# Patient Record
Sex: Male | Born: 1955 | Race: White | Hispanic: No | Marital: Married | Smoking: Never smoker
Health system: Southern US, Community
[De-identification: ages and names within clinical notes are randomized; demographics above are authoritative.]

## PROBLEM LIST (undated history)

## (undated) DIAGNOSIS — N189 Chronic kidney disease, unspecified: Secondary | ICD-10-CM

## (undated) DIAGNOSIS — K219 Gastro-esophageal reflux disease without esophagitis: Secondary | ICD-10-CM

## (undated) DIAGNOSIS — I10 Essential (primary) hypertension: Secondary | ICD-10-CM

## (undated) DIAGNOSIS — M1990 Unspecified osteoarthritis, unspecified site: Secondary | ICD-10-CM

## (undated) HISTORY — PX: TONSILLECTOMY: SUR1361

## (undated) HISTORY — DX: Chronic kidney disease, unspecified: N18.9

## (undated) HISTORY — PX: APPENDECTOMY: SHX54

## (undated) HISTORY — DX: Unspecified osteoarthritis, unspecified site: M19.90

## (undated) HISTORY — DX: Gastro-esophageal reflux disease without esophagitis: K21.9

---

## 2015-07-07 ENCOUNTER — Other Ambulatory Visit: Payer: Self-pay | Admitting: Family Medicine

## 2015-07-07 DIAGNOSIS — R7989 Other specified abnormal findings of blood chemistry: Secondary | ICD-10-CM

## 2015-07-07 DIAGNOSIS — R945 Abnormal results of liver function studies: Principal | ICD-10-CM

## 2015-07-16 ENCOUNTER — Ambulatory Visit
Admission: RE | Admit: 2015-07-16 | Discharge: 2015-07-16 | Disposition: A | Discharge status: Home / Self Care | Payer: BLUE CROSS/BLUE SHIELD | Source: Ambulatory Visit | Attending: Family Medicine | Admitting: Family Medicine

## 2015-07-16 DIAGNOSIS — R945 Abnormal results of liver function studies: Principal | ICD-10-CM

## 2015-07-16 DIAGNOSIS — R7989 Other specified abnormal findings of blood chemistry: Secondary | ICD-10-CM

## 2016-02-16 DIAGNOSIS — Z125 Encounter for screening for malignant neoplasm of prostate: Secondary | ICD-10-CM | POA: Diagnosis not present

## 2016-02-16 DIAGNOSIS — Z136 Encounter for screening for cardiovascular disorders: Secondary | ICD-10-CM | POA: Diagnosis not present

## 2016-02-16 DIAGNOSIS — Z0000 Encounter for general adult medical examination without abnormal findings: Secondary | ICD-10-CM | POA: Diagnosis not present

## 2016-02-16 DIAGNOSIS — R739 Hyperglycemia, unspecified: Secondary | ICD-10-CM | POA: Diagnosis not present

## 2016-02-18 DIAGNOSIS — K760 Fatty (change of) liver, not elsewhere classified: Secondary | ICD-10-CM | POA: Diagnosis not present

## 2016-02-18 DIAGNOSIS — I10 Essential (primary) hypertension: Secondary | ICD-10-CM | POA: Diagnosis not present

## 2016-02-18 DIAGNOSIS — E785 Hyperlipidemia, unspecified: Secondary | ICD-10-CM | POA: Diagnosis not present

## 2016-03-02 DIAGNOSIS — I10 Essential (primary) hypertension: Secondary | ICD-10-CM | POA: Diagnosis not present

## 2016-03-02 DIAGNOSIS — Z6841 Body Mass Index (BMI) 40.0 and over, adult: Secondary | ICD-10-CM | POA: Diagnosis not present

## 2016-03-02 DIAGNOSIS — K529 Noninfective gastroenteritis and colitis, unspecified: Secondary | ICD-10-CM | POA: Diagnosis not present

## 2016-03-31 DIAGNOSIS — Z6841 Body Mass Index (BMI) 40.0 and over, adult: Secondary | ICD-10-CM | POA: Diagnosis not present

## 2016-03-31 DIAGNOSIS — I10 Essential (primary) hypertension: Secondary | ICD-10-CM | POA: Diagnosis not present

## 2016-03-31 DIAGNOSIS — E785 Hyperlipidemia, unspecified: Secondary | ICD-10-CM | POA: Diagnosis not present

## 2016-04-26 DIAGNOSIS — D2312 Other benign neoplasm of skin of left eyelid, including canthus: Secondary | ICD-10-CM | POA: Diagnosis not present

## 2016-04-26 DIAGNOSIS — I10 Essential (primary) hypertension: Secondary | ICD-10-CM | POA: Diagnosis not present

## 2016-04-26 DIAGNOSIS — H524 Presbyopia: Secondary | ICD-10-CM | POA: Diagnosis not present

## 2016-04-26 DIAGNOSIS — D2311 Other benign neoplasm of skin of right eyelid, including canthus: Secondary | ICD-10-CM | POA: Diagnosis not present

## 2016-04-26 DIAGNOSIS — R945 Abnormal results of liver function studies: Secondary | ICD-10-CM | POA: Diagnosis not present

## 2016-04-26 DIAGNOSIS — K760 Fatty (change of) liver, not elsewhere classified: Secondary | ICD-10-CM | POA: Diagnosis not present

## 2016-04-26 DIAGNOSIS — H52203 Unspecified astigmatism, bilateral: Secondary | ICD-10-CM | POA: Diagnosis not present

## 2016-07-28 DIAGNOSIS — I10 Essential (primary) hypertension: Secondary | ICD-10-CM | POA: Diagnosis not present

## 2016-07-28 DIAGNOSIS — Z23 Encounter for immunization: Secondary | ICD-10-CM | POA: Diagnosis not present

## 2016-07-28 DIAGNOSIS — R945 Abnormal results of liver function studies: Secondary | ICD-10-CM | POA: Diagnosis not present

## 2016-08-01 DIAGNOSIS — I10 Essential (primary) hypertension: Secondary | ICD-10-CM | POA: Diagnosis not present

## 2016-08-01 DIAGNOSIS — R945 Abnormal results of liver function studies: Secondary | ICD-10-CM | POA: Diagnosis not present

## 2016-08-01 DIAGNOSIS — Z6841 Body Mass Index (BMI) 40.0 and over, adult: Secondary | ICD-10-CM | POA: Diagnosis not present

## 2016-11-26 IMAGING — US US ABDOMEN COMPLETE
1 series · 13 of 25 positions shown · non-contrast
Comparison: None in PACs

CLINICAL DATA: Elevated liver function studies

EXAM:
ABDOMEN ULTRASOUND COMPLETE

[Series 1: us abdomen complete · 0.32mm/px · 13 of 87 slices shown]
[im 1/87]
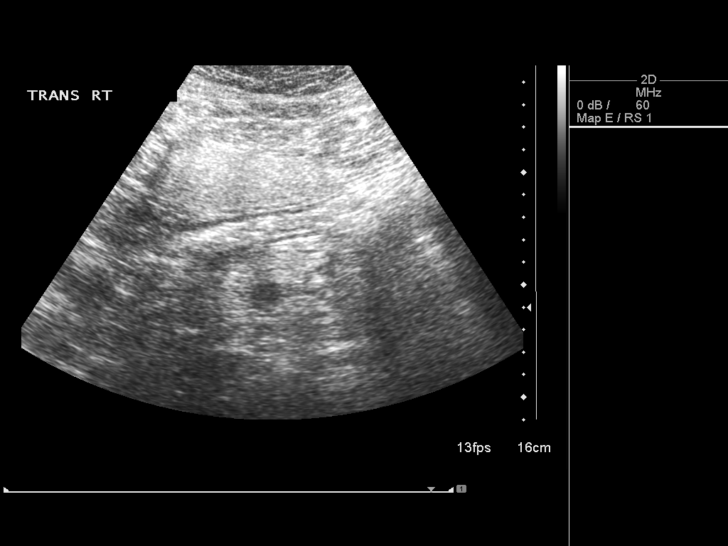
[im 8/87]
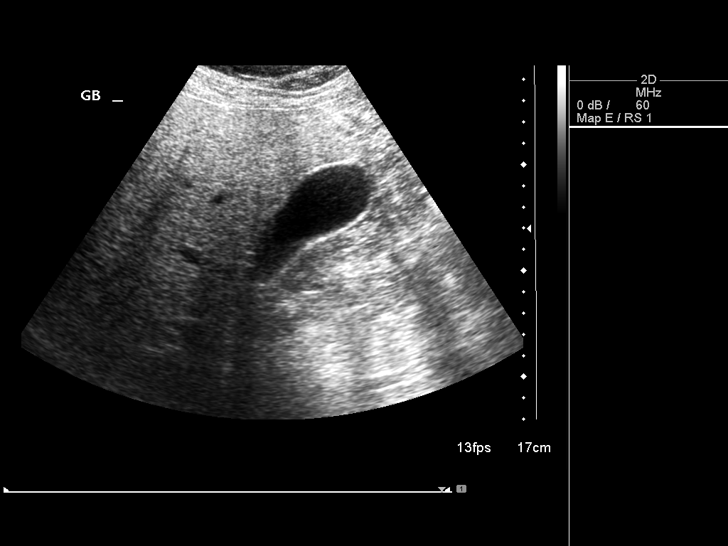
[im 15/87]
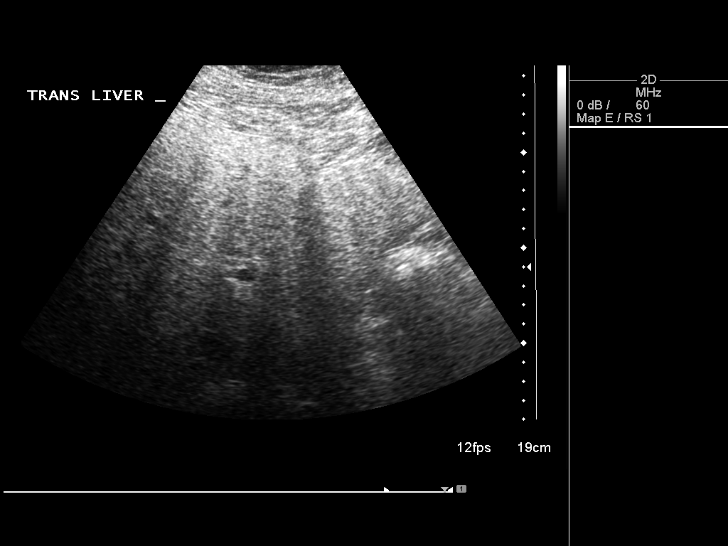
[im 22/87]
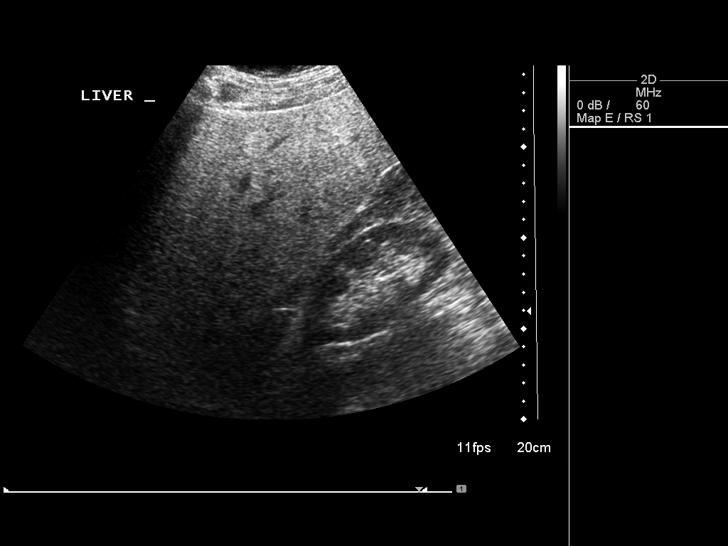
[im 29/87]
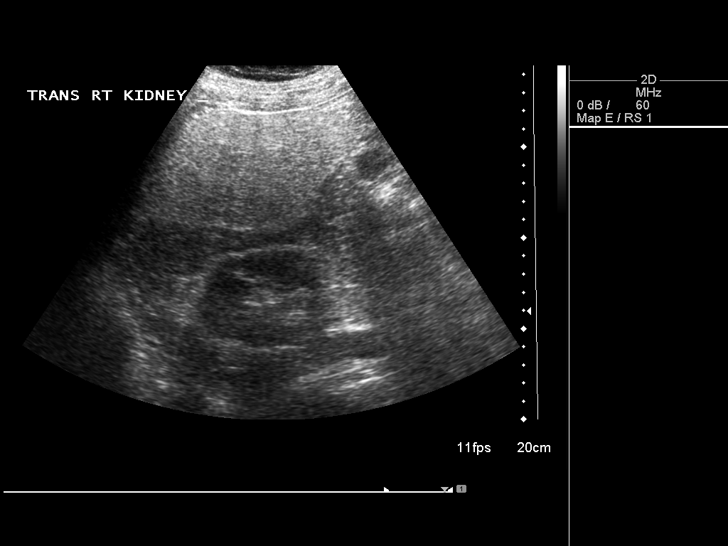
[im 36/87]
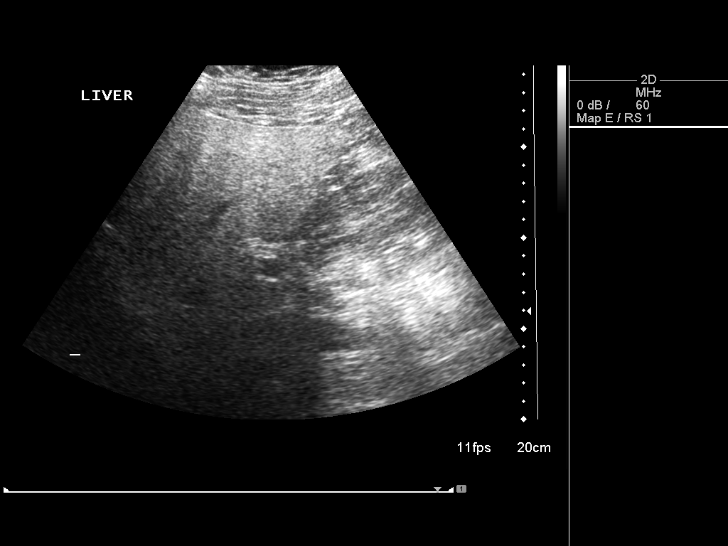
[im 44/87]
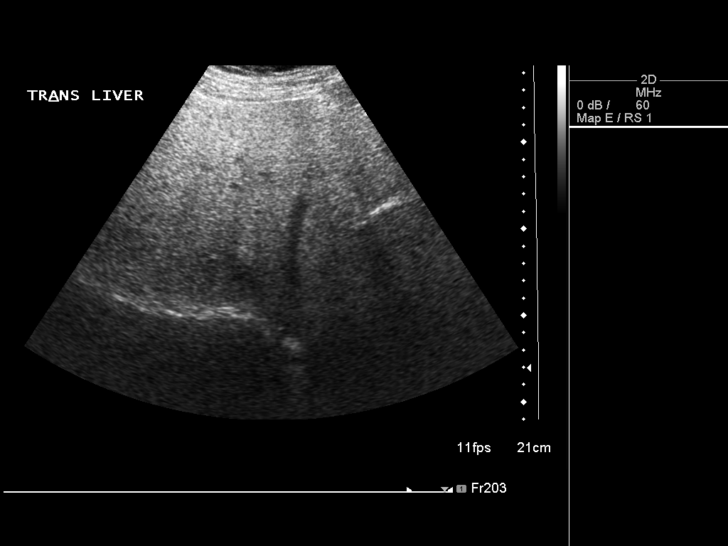
[im 51/87]
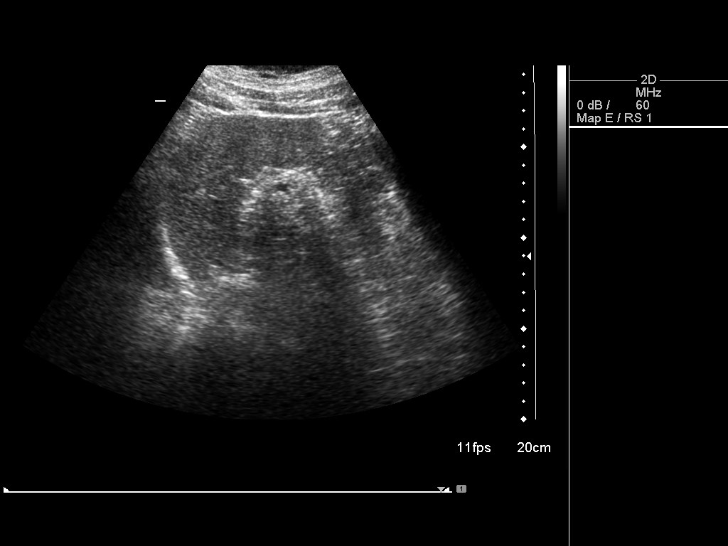
[im 58/87]
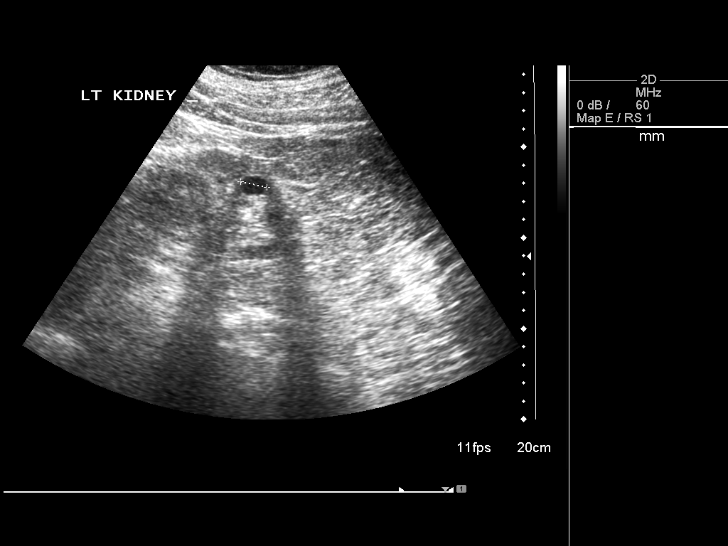
[im 65/87]
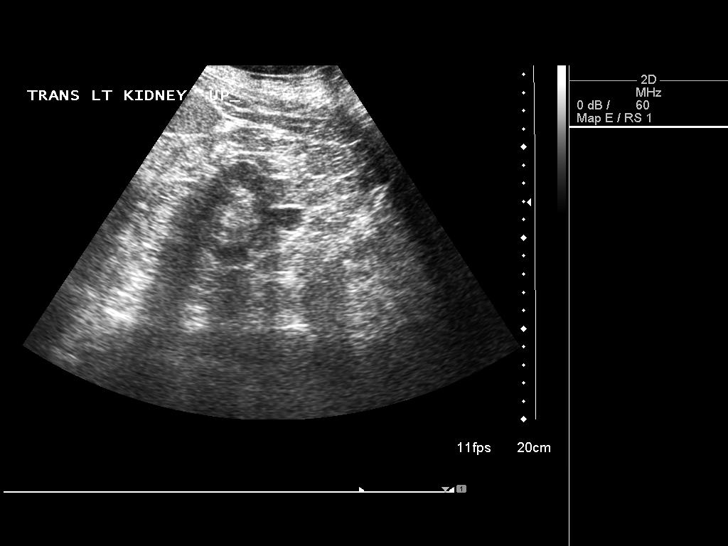
[im 72/87]
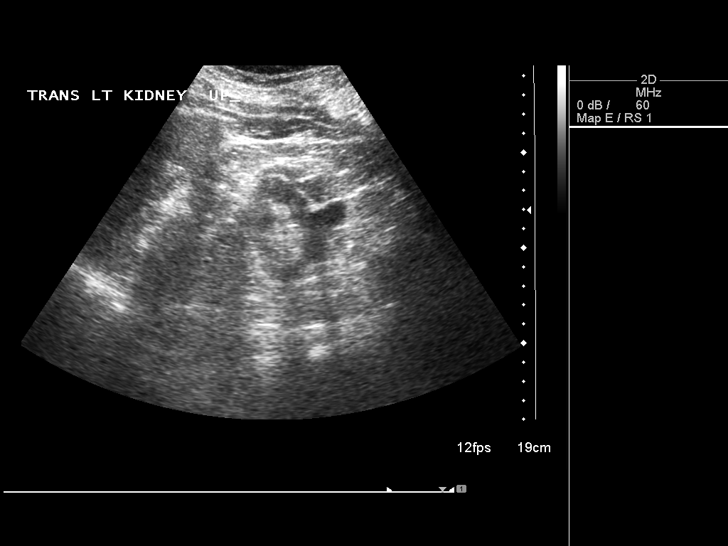
[im 79/87]
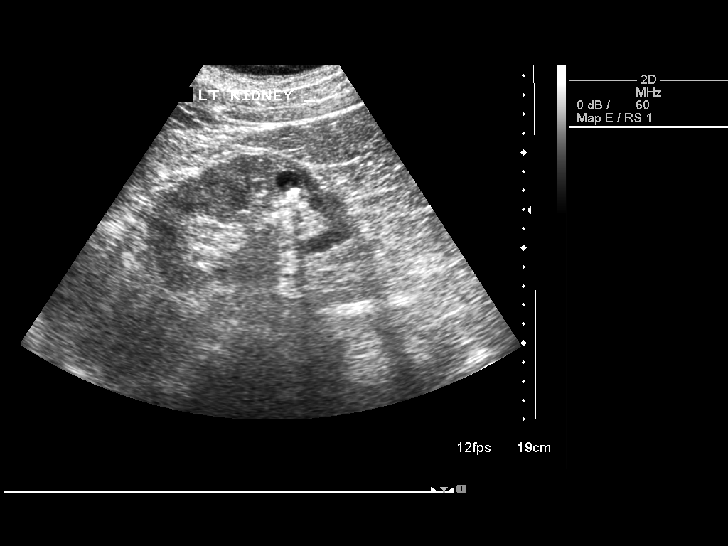
[im 87/87]
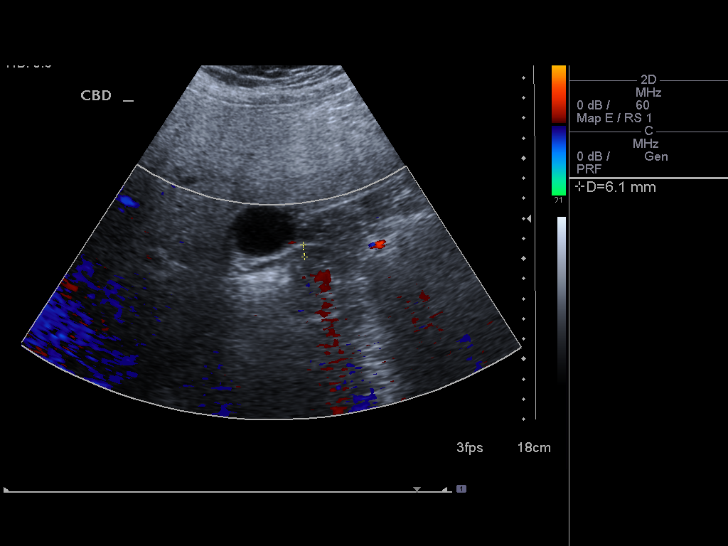

[13 of 25 positions shown; findings below may reference images not displayed]

FINDINGS: The study is limited due to the patient's body habitus as well as
due to bowel gas.

Gallbladder: No gallstones or wall thickening visualized. No
sonographic Murphy sign noted by sonographer.

Common bile duct: Diameter: 6 mm

Liver: The hepatic echotexture is increased diffusely. There is no
focal mass or ductal dilation.

IVC: No abnormality visualized.

Pancreas: Visualized portion unremarkable.

Spleen: Size and appearance within normal limits.

Right Kidney: Length: 11 cm. Echogenicity within normal limits. No
mass or hydronephrosis visualized.

Left Kidney: Length: 10.7 cm. The renal cortical echotexture is
normal. There is an upper pole cyst measuring 1.9 cm in diameter.
There 2 lower pole cysts. One measures 1.5 cm and the second 0.6 cm
in diameter. There is an 8 mm nonobstructing calcified shadowing
stone in the lower pole.

Abdominal aorta: The bowel gas is demonstrated with some difficulty
due to bowel gas. The maximal measured diameter is 3 cm but this
measurement is approximate.

Other findings: No free fluid is observed.
IMPRESSION: 1. Fatty infiltrative change of the liver. No acute gallbladder
pathology. The observed portions of the pancreas are normal.
2. 8 mm nonobstructing lower pole left kidney stone. Simple
appearing cysts in the left kidney.
3. Limited visualization of the abdominal aorta without definite
aneurysm. The maximal measured diameter occurs proximally and is 3
cm.

## 2017-02-21 DIAGNOSIS — Z1329 Encounter for screening for other suspected endocrine disorder: Secondary | ICD-10-CM | POA: Diagnosis not present

## 2017-02-21 DIAGNOSIS — Z114 Encounter for screening for human immunodeficiency virus [HIV]: Secondary | ICD-10-CM | POA: Diagnosis not present

## 2017-02-21 DIAGNOSIS — Z13220 Encounter for screening for lipoid disorders: Secondary | ICD-10-CM | POA: Diagnosis not present

## 2017-02-21 DIAGNOSIS — Z0000 Encounter for general adult medical examination without abnormal findings: Secondary | ICD-10-CM | POA: Diagnosis not present

## 2017-02-21 DIAGNOSIS — Z125 Encounter for screening for malignant neoplasm of prostate: Secondary | ICD-10-CM | POA: Diagnosis not present

## 2017-02-28 DIAGNOSIS — Z1211 Encounter for screening for malignant neoplasm of colon: Secondary | ICD-10-CM | POA: Diagnosis not present

## 2017-02-28 DIAGNOSIS — Z0000 Encounter for general adult medical examination without abnormal findings: Secondary | ICD-10-CM | POA: Diagnosis not present

## 2017-02-28 DIAGNOSIS — Z6839 Body mass index (BMI) 39.0-39.9, adult: Secondary | ICD-10-CM | POA: Diagnosis not present

## 2017-04-06 ENCOUNTER — Encounter: Payer: Self-pay | Admitting: Family Medicine

## 2017-05-30 DIAGNOSIS — M19042 Primary osteoarthritis, left hand: Secondary | ICD-10-CM | POA: Diagnosis not present

## 2017-05-30 DIAGNOSIS — I10 Essential (primary) hypertension: Secondary | ICD-10-CM | POA: Diagnosis not present

## 2017-05-30 DIAGNOSIS — R739 Hyperglycemia, unspecified: Secondary | ICD-10-CM | POA: Diagnosis not present

## 2017-05-30 DIAGNOSIS — Z6841 Body Mass Index (BMI) 40.0 and over, adult: Secondary | ICD-10-CM | POA: Diagnosis not present

## 2017-10-30 DIAGNOSIS — R739 Hyperglycemia, unspecified: Secondary | ICD-10-CM | POA: Diagnosis not present

## 2017-10-30 DIAGNOSIS — I10 Essential (primary) hypertension: Secondary | ICD-10-CM | POA: Diagnosis not present

## 2017-10-30 DIAGNOSIS — M19042 Primary osteoarthritis, left hand: Secondary | ICD-10-CM | POA: Diagnosis not present

## 2017-10-30 DIAGNOSIS — M170 Bilateral primary osteoarthritis of knee: Secondary | ICD-10-CM | POA: Diagnosis not present

## 2018-03-08 DIAGNOSIS — Z13220 Encounter for screening for lipoid disorders: Secondary | ICD-10-CM | POA: Diagnosis not present

## 2018-03-08 DIAGNOSIS — Z0000 Encounter for general adult medical examination without abnormal findings: Secondary | ICD-10-CM | POA: Diagnosis not present

## 2018-03-08 DIAGNOSIS — Z114 Encounter for screening for human immunodeficiency virus [HIV]: Secondary | ICD-10-CM | POA: Diagnosis not present

## 2018-03-08 DIAGNOSIS — Z125 Encounter for screening for malignant neoplasm of prostate: Secondary | ICD-10-CM | POA: Diagnosis not present

## 2018-03-08 DIAGNOSIS — Z1329 Encounter for screening for other suspected endocrine disorder: Secondary | ICD-10-CM | POA: Diagnosis not present

## 2018-03-13 DIAGNOSIS — Z0000 Encounter for general adult medical examination without abnormal findings: Secondary | ICD-10-CM | POA: Diagnosis not present

## 2018-03-13 DIAGNOSIS — N529 Male erectile dysfunction, unspecified: Secondary | ICD-10-CM | POA: Diagnosis not present

## 2018-03-13 DIAGNOSIS — I10 Essential (primary) hypertension: Secondary | ICD-10-CM | POA: Diagnosis not present

## 2018-03-13 DIAGNOSIS — Z1211 Encounter for screening for malignant neoplasm of colon: Secondary | ICD-10-CM | POA: Diagnosis not present

## 2018-03-13 DIAGNOSIS — Z23 Encounter for immunization: Secondary | ICD-10-CM | POA: Diagnosis not present

## 2018-03-13 DIAGNOSIS — R739 Hyperglycemia, unspecified: Secondary | ICD-10-CM | POA: Diagnosis not present

## 2018-03-13 DIAGNOSIS — Z6837 Body mass index (BMI) 37.0-37.9, adult: Secondary | ICD-10-CM | POA: Diagnosis not present

## 2018-06-06 ENCOUNTER — Encounter: Payer: Self-pay | Admitting: Family Medicine

## 2018-06-13 DIAGNOSIS — N529 Male erectile dysfunction, unspecified: Secondary | ICD-10-CM | POA: Diagnosis not present

## 2018-06-13 DIAGNOSIS — Z23 Encounter for immunization: Secondary | ICD-10-CM | POA: Diagnosis not present

## 2018-06-13 DIAGNOSIS — R739 Hyperglycemia, unspecified: Secondary | ICD-10-CM | POA: Diagnosis not present

## 2018-06-13 DIAGNOSIS — Z6836 Body mass index (BMI) 36.0-36.9, adult: Secondary | ICD-10-CM | POA: Diagnosis not present

## 2018-06-13 DIAGNOSIS — I10 Essential (primary) hypertension: Secondary | ICD-10-CM | POA: Diagnosis not present

## 2018-11-15 DIAGNOSIS — I10 Essential (primary) hypertension: Secondary | ICD-10-CM | POA: Diagnosis not present

## 2018-11-15 DIAGNOSIS — N529 Male erectile dysfunction, unspecified: Secondary | ICD-10-CM | POA: Diagnosis not present

## 2018-11-15 DIAGNOSIS — Z719 Counseling, unspecified: Secondary | ICD-10-CM | POA: Diagnosis not present

## 2018-12-07 DIAGNOSIS — R739 Hyperglycemia, unspecified: Secondary | ICD-10-CM | POA: Diagnosis not present

## 2018-12-12 DIAGNOSIS — Z23 Encounter for immunization: Secondary | ICD-10-CM | POA: Diagnosis not present

## 2018-12-12 DIAGNOSIS — R739 Hyperglycemia, unspecified: Secondary | ICD-10-CM | POA: Diagnosis not present

## 2018-12-12 DIAGNOSIS — N529 Male erectile dysfunction, unspecified: Secondary | ICD-10-CM | POA: Diagnosis not present

## 2018-12-12 DIAGNOSIS — I10 Essential (primary) hypertension: Secondary | ICD-10-CM | POA: Diagnosis not present

## 2019-01-28 DIAGNOSIS — N529 Male erectile dysfunction, unspecified: Secondary | ICD-10-CM | POA: Diagnosis not present

## 2019-01-28 DIAGNOSIS — I10 Essential (primary) hypertension: Secondary | ICD-10-CM | POA: Diagnosis not present

## 2019-01-28 DIAGNOSIS — N471 Phimosis: Secondary | ICD-10-CM | POA: Diagnosis not present

## 2019-03-21 DIAGNOSIS — Z125 Encounter for screening for malignant neoplasm of prostate: Secondary | ICD-10-CM | POA: Diagnosis not present

## 2019-03-21 DIAGNOSIS — Z1159 Encounter for screening for other viral diseases: Secondary | ICD-10-CM | POA: Diagnosis not present

## 2019-03-21 DIAGNOSIS — Z0000 Encounter for general adult medical examination without abnormal findings: Secondary | ICD-10-CM | POA: Diagnosis not present

## 2019-03-26 DIAGNOSIS — Z23 Encounter for immunization: Secondary | ICD-10-CM | POA: Diagnosis not present

## 2019-03-26 DIAGNOSIS — Z6836 Body mass index (BMI) 36.0-36.9, adult: Secondary | ICD-10-CM | POA: Diagnosis not present

## 2019-03-26 DIAGNOSIS — Z1211 Encounter for screening for malignant neoplasm of colon: Secondary | ICD-10-CM | POA: Diagnosis not present

## 2019-03-26 DIAGNOSIS — Z0000 Encounter for general adult medical examination without abnormal findings: Secondary | ICD-10-CM | POA: Diagnosis not present

## 2019-04-03 DIAGNOSIS — R2232 Localized swelling, mass and lump, left upper limb: Secondary | ICD-10-CM | POA: Diagnosis not present

## 2019-04-03 DIAGNOSIS — H5203 Hypermetropia, bilateral: Secondary | ICD-10-CM | POA: Diagnosis not present

## 2019-04-03 DIAGNOSIS — H524 Presbyopia: Secondary | ICD-10-CM | POA: Diagnosis not present

## 2019-04-03 DIAGNOSIS — H2513 Age-related nuclear cataract, bilateral: Secondary | ICD-10-CM | POA: Diagnosis not present

## 2019-04-03 DIAGNOSIS — N529 Male erectile dysfunction, unspecified: Secondary | ICD-10-CM | POA: Diagnosis not present

## 2019-04-10 DIAGNOSIS — M79645 Pain in left finger(s): Secondary | ICD-10-CM | POA: Diagnosis not present

## 2019-04-10 DIAGNOSIS — R2232 Localized swelling, mass and lump, left upper limb: Secondary | ICD-10-CM | POA: Diagnosis not present

## 2019-09-04 DIAGNOSIS — N529 Male erectile dysfunction, unspecified: Secondary | ICD-10-CM | POA: Diagnosis not present

## 2019-09-04 DIAGNOSIS — M19042 Primary osteoarthritis, left hand: Secondary | ICD-10-CM | POA: Diagnosis not present

## 2019-09-04 DIAGNOSIS — R5383 Other fatigue: Secondary | ICD-10-CM | POA: Diagnosis not present

## 2019-09-04 DIAGNOSIS — Z7189 Other specified counseling: Secondary | ICD-10-CM | POA: Diagnosis not present

## 2019-09-04 DIAGNOSIS — Z20828 Contact with and (suspected) exposure to other viral communicable diseases: Secondary | ICD-10-CM | POA: Diagnosis not present

## 2019-09-04 DIAGNOSIS — I10 Essential (primary) hypertension: Secondary | ICD-10-CM | POA: Diagnosis not present

## 2019-09-04 DIAGNOSIS — Z03818 Encounter for observation for suspected exposure to other biological agents ruled out: Secondary | ICD-10-CM | POA: Diagnosis not present

## 2019-09-04 DIAGNOSIS — E669 Obesity, unspecified: Secondary | ICD-10-CM | POA: Diagnosis not present

## 2019-09-30 DIAGNOSIS — Z20828 Contact with and (suspected) exposure to other viral communicable diseases: Secondary | ICD-10-CM | POA: Diagnosis not present

## 2019-09-30 DIAGNOSIS — Z7189 Other specified counseling: Secondary | ICD-10-CM | POA: Diagnosis not present

## 2019-09-30 DIAGNOSIS — E669 Obesity, unspecified: Secondary | ICD-10-CM | POA: Diagnosis not present

## 2019-09-30 DIAGNOSIS — R5383 Other fatigue: Secondary | ICD-10-CM | POA: Diagnosis not present

## 2019-10-04 ENCOUNTER — Other Ambulatory Visit: Payer: Self-pay

## 2019-10-04 ENCOUNTER — Ambulatory Visit
Admission: EM | Admit: 2019-10-04 | Discharge: 2019-10-04 | Disposition: A | Discharge status: Home / Self Care | Payer: BC Managed Care – PPO

## 2019-10-04 DIAGNOSIS — U071 COVID-19: Secondary | ICD-10-CM

## 2019-10-04 HISTORY — DX: Essential (primary) hypertension: I10

## 2019-10-04 MED ORDER — AZITHROMYCIN 250 MG PO TABS: 250.0000 mg | ORAL_TABLET | Freq: Every day | ORAL | 0 refills | Status: DC

## 2019-10-04 NOTE — ED Triage Notes (Signed)
Pt presents with complaints of fever. Reports being positive for COVID and having continued fevers at home. Pt has been taking ibuprofen at home. Temperature is 100 in patient intake. Pt denies any other concerns.

## 2019-10-04 NOTE — ED Provider Notes (Signed)
[name]    CSN: [id number] Arrival date & time:         History   Chief Complaint Chief Complaint  Patient presents with  . Fever    HPI Tyler Sanders is a 64 y.o. male with history of hypertension presenting for fever.  States that he developed nocturnal fever last weekend, underwent Covid testing on Tuesday due to exposure from coworker, and was positive.  Since diagnosis, patient has noted fever during the day.  T-max 102F earlier this week.  Has been taking ibuprofen with some relief.  Denying chills, fatigue, decreased appetite, cough, shortness of breath, chest pain, palpitations, vomiting, diarrhea.  Inquiring about what else he can take for his fever.   Past Medical History:  Diagnosis Date  . Hypertension     There are no problems to display for this patient.   Past Surgical History:  Procedure Laterality Date  . APPENDECTOMY    . TONSILLECTOMY         Home Medications    Prior to Admission medications   Medication Sig Start Date End Date Taking? Authorizing Provider  UNKNOWN TO PATIENT 2 RX's patient does not know the names.   Yes [provider]  azithromycin (ZITHROMAX) 250 MG tablet Take 1 tablet (250 mg total) by mouth daily. Take first 2 tablets together, then 1 every day until finished. 10/04/19   Hall-Potvin, [place], PA-C    Family History Family History  Problem Relation Age of Onset  . Heart disease Mother   . Alcohol abuse Father     Social History Social History   Tobacco Use  . Smoking status: Never Smoker  . Smokeless tobacco: Never Used  Substance Use Topics  . Alcohol use: Yes    Comment: occ  . Drug use: Not on file     Allergies   Patient has no known allergies.   Review of Systems As per HPI   Physical Exam Triage Vital Signs ED Triage Vitals  Enc Vitals Group     BP      Pulse      Resp      Temp      Temp src      SpO2      Weight      Height      Head Circumference    Peak Flow      Pain Score      Pain Loc      Pain Edu?      Excl. in [place]?    No data found.  Updated Vital Signs BP 121/83 (BP Location: Left Arm)   Pulse 87   Temp 100 F (37.8 C) (Oral)   Resp 18   SpO2 91%   Visual Acuity Right Eye Distance:   Left Eye Distance:   Bilateral Distance:    Right Eye Near:   Left Eye Near:    Bilateral Near:     Physical Exam Constitutional:      General: He is not in acute distress.    Appearance: He is not ill-appearing.  HENT:     Head: Normocephalic and atraumatic.     Right Ear: Tympanic membrane, ear canal and external ear normal.     Left Ear: Tympanic membrane, ear canal and external ear normal.     Nose: Nose normal.     Mouth/Throat:     Mouth: Mucous membranes are moist.     Pharynx: Oropharynx is clear.  Eyes:  General: No scleral icterus.    Conjunctiva/sclera: Conjunctivae normal.     Pupils: Pupils are equal, round, and reactive to light.  Cardiovascular:     Rate and Rhythm: Normal rate and regular rhythm.     Heart sounds: No murmur. No gallop.   Pulmonary:     Effort: Pulmonary effort is normal. No respiratory distress.     Breath sounds: No stridor. Rales present. No wheezing.     Comments: RLL rales Musculoskeletal:        General: Normal range of motion.     Cervical back: Neck supple. No tenderness.     Right lower leg: No edema.     Left lower leg: No edema.  Lymphadenopathy:     Cervical: No cervical adenopathy.  Skin:    Capillary Refill: Capillary refill takes less than 2 seconds.     Coloration: Skin is not jaundiced or pale.     Findings: No bruising.  Neurological:     General: No focal deficit present.     Mental Status: He is alert and oriented to person, place, and time.      UC Treatments / Results  Labs (all labs ordered are listed, but only abnormal results are displayed) Labs Reviewed - No data to display  EKG   Radiology No results found.  Procedures Procedures  (including critical care time)  Medications Ordered in UC Medications - No data to display  Initial Impression / Assessment and Plan / UC Course  I have reviewed the triage vital signs and the nursing notes.  Pertinent labs & imaging results that were available during my care of the patient were reviewed by me and considered in my medical decision making (see chart for details).     Patient febrile, nontoxic in office today.  No respiratory distress or chest pain.  Reviewed fever is expected in setting of acute COVID-19 infection.  Advised patient to take Tylenol as antipyretic.  Patient with O2 sats 91-95% in office w/ RLL rales: will start azithromycin today, have patient follow-up with PCP in 1 week for repeat evaluation. Final Clinical Impressions(s) / UC Diagnoses   Final diagnoses:  [id number] virus infection     Discharge Instructions     Take Zpack as prescribed.  For fever/aches: recommend 350 mg-1000 mg of Tylenol (acetaminophen) and/or 200 mg - 800 mg of Advil (ibuprofen, Motrin) every 8 hours as needed.  May alternate between the two throughout the day as they are generally safe to take together.  DO NOT exceed more than 3000 mg of Tylenol or 3200 mg of ibuprofen in a 24 hour period as this could damage your stomach, kidneys, liver, or increase your bleeding risk.    ED Prescriptions    Medication Sig Dispense Auth. Provider   azithromycin (ZITHROMAX) 250 MG tablet Take 1 tablet (250 mg total) by mouth daily. Take first 2 tablets together, then 1 every day until finished. 6 tablet Hall-Potvin, [place], PA-C     PDMP not reviewed this encounter.   Hall-Potvin, [place], [place] 10/04/19 1441

## 2019-10-04 NOTE — Discharge Instructions (Addendum)
Take Zpack as prescribed.  For fever/aches: recommend 350 mg-1000 mg of Tylenol (acetaminophen) and/or 200 mg - 800 mg of Advil (ibuprofen, Motrin) every 8 hours as needed.  May alternate between the two throughout the day as they are generally safe to take together.  DO NOT exceed more than 3000 mg of Tylenol or 3200 mg of ibuprofen in a 24 hour period as this could damage your stomach, kidneys, liver, or increase your bleeding risk.

## 2019-10-11 DIAGNOSIS — U071 COVID-19: Secondary | ICD-10-CM | POA: Diagnosis not present

## 2020-02-21 ENCOUNTER — Other Ambulatory Visit: Payer: Self-pay

## 2020-02-21 ENCOUNTER — Ambulatory Visit
Admission: EM | Admit: 2020-02-21 | Discharge: 2020-02-21 | Disposition: A | Discharge status: Home / Self Care | Payer: BC Managed Care – PPO | Attending: Emergency Medicine | Admitting: Emergency Medicine

## 2020-02-21 DIAGNOSIS — L03811 Cellulitis of head [any part, except face]: Secondary | ICD-10-CM | POA: Diagnosis not present

## 2020-02-21 MED ORDER — AMOXICILLIN-POT CLAVULANATE 875-125 MG PO TABS: 1.0000 | ORAL_TABLET | Freq: Two times a day (BID) | ORAL | 0 refills | Status: AC

## 2020-02-21 NOTE — Discharge Instructions (Signed)
Keep area(s) clean and dry. °Apply hot compress / towel for 5-10 minutes 3-5 times daily. °Take antibiotic as prescribed with food - important to complete course. °Return for worsening pain, redness, swelling, discharge, fever. ° °Helpful prevention tips: °Keep nails short to avoid secondary skin infections. °Use new, clean razors when shaving. °Avoid antiperspirants - look for deodorants without aluminum. °Avoid wearing underwire bras as this can irritate the area further.  °

## 2020-02-21 NOTE — ED Triage Notes (Signed)
Pt c/o abscess behind rt ear for the past few days. No drainage noted. Pt c/o pain and states feels like its going down his neck.

## 2020-02-21 NOTE — ED Provider Notes (Signed)
[name]    CSN: [id number] Arrival date & time: 02/21/20  0803      History   Chief Complaint Chief Complaint  Patient presents with  . Abscess    HPI Tyler Sanders is a 64 y.o. male with history of hypertension presenting for right-sided postauricular pain and swelling for the last few days.  Denies discharge, drainage, nuchal rigidity or fever.  No arthralgias, myalgias, chills.  States that he was scratching behind his ear, though denies known bug bite.  No malaise or fatigue.  Has not tried thing for this.   Past Medical History:  Diagnosis Date  . Hypertension     There are no problems to display for this patient.   Past Surgical History:  Procedure Laterality Date  . APPENDECTOMY    . TONSILLECTOMY         Home Medications    Prior to Admission medications   Medication Sig Start Date End Date Taking? Authorizing Provider  amoxicillin-clavulanate (AUGMENTIN) 875-125 MG tablet Take 1 tablet by mouth every 12 (twelve) hours for 10 days. 02/21/20 03/02/20  Hall-Potvin, [place], PA-C  UNKNOWN TO PATIENT 2 RX's patient does not know the names.    [provider]    Family History Family History  Problem Relation Age of Onset  . Heart disease Mother   . Alcohol abuse Father     Social History Social History   Tobacco Use  . Smoking status: Never Smoker  . Smokeless tobacco: Never Used  Substance Use Topics  . Alcohol use: Yes    Comment: occ  . Drug use: Not Currently     Allergies   Patient has no known allergies.   Review of Systems As per HPI   Physical Exam Triage Vital Signs ED Triage Vitals  Enc Vitals Group     BP      Pulse      Resp      Temp      Temp src      SpO2      Weight      Height      Head Circumference      Peak Flow      Pain Score      Pain Loc      Pain Edu?      Excl. in [place]?    No data found.  Updated Vital Signs BP (!) 151/95 (BP Location: Left Arm)   Pulse 77   Temp  98.7 F (37.1 C) (Oral)   Resp 16   SpO2 95%   Visual Acuity Right Eye Distance:   Left Eye Distance:   Bilateral Distance:    Right Eye Near:   Left Eye Near:    Bilateral Near:     Physical Exam Constitutional:      General: He is not in acute distress. HENT:     Head: Normocephalic and atraumatic.  Eyes:     General: No scleral icterus.    Pupils: Pupils are equal, round, and reactive to light.  Neck:     Comments: Right side, without cervical LAD Cardiovascular:     Rate and Rhythm: Normal rate.  Pulmonary:     Effort: Pulmonary effort is normal. No respiratory distress.     Breath sounds: No wheezing.  Musculoskeletal:     Cervical back: Normal range of motion. Tenderness present. No rigidity.  Skin:    Coloration: Skin is not jaundiced or pale.  Comments: 2 cm area of erythema, induration.  Overlying scab without foreign body.  Mild purulence expressed.  Neurological:     Mental Status: He is alert and oriented to person, place, and time.      UC Treatments / Results  Labs (all labs ordered are listed, but only abnormal results are displayed) Labs Reviewed - No data to display  EKG   Radiology No results found.  Procedures Procedures (including critical care time)  Medications Ordered in UC Medications - No data to display  Initial Impression / Assessment and Plan / UC Course  I have reviewed the triage vital signs and the nursing notes.  Pertinent labs & imaging results that were available during my care of the patient were reviewed by me and considered in my medical decision making (see chart for details).     H&P concerning for scalp abscess plus/minus cellulitis.  Reviewed supportive care as outlined below, will start Augmentin.  Return precautions discussed, pt verbalized understanding and is agreeable to plan. Final Clinical Impressions(s) / UC Diagnoses   Final diagnoses:  Abscess or cellulitis of scalp     Discharge Instructions      Keep area(s) clean and dry. Apply hot compress / towel for 5-10 minutes 3-5 times daily. Take antibiotic as prescribed with food - important to complete course. Return for worsening pain, redness, swelling, discharge, fever.  Helpful prevention tips: Keep nails short to avoid secondary skin infections. Use new, clean razors when shaving. Avoid antiperspirants - look for deodorants without aluminum. Avoid wearing underwire bras as this can irritate the area further.     ED Prescriptions    Medication Sig Dispense Auth. Provider   amoxicillin-clavulanate (AUGMENTIN) 875-125 MG tablet Take 1 tablet by mouth every 12 (twelve) hours for 10 days. 20 tablet Hall-Potvin, [place], PA-C     PDMP not reviewed this encounter.   Hall-Potvin, [place], [place] 02/21/20 1151

## 2020-04-24 DIAGNOSIS — Z1159 Encounter for screening for other viral diseases: Secondary | ICD-10-CM | POA: Diagnosis not present

## 2020-04-24 DIAGNOSIS — Z0000 Encounter for general adult medical examination without abnormal findings: Secondary | ICD-10-CM | POA: Diagnosis not present

## 2020-04-24 DIAGNOSIS — Z13220 Encounter for screening for lipoid disorders: Secondary | ICD-10-CM | POA: Diagnosis not present

## 2020-04-29 DIAGNOSIS — Z0000 Encounter for general adult medical examination without abnormal findings: Secondary | ICD-10-CM | POA: Diagnosis not present

## 2020-04-29 DIAGNOSIS — R739 Hyperglycemia, unspecified: Secondary | ICD-10-CM | POA: Diagnosis not present

## 2020-04-29 DIAGNOSIS — Z23 Encounter for immunization: Secondary | ICD-10-CM | POA: Diagnosis not present

## 2020-06-01 DIAGNOSIS — Z23 Encounter for immunization: Secondary | ICD-10-CM | POA: Diagnosis not present

## 2020-06-23 DIAGNOSIS — I10 Essential (primary) hypertension: Secondary | ICD-10-CM | POA: Diagnosis not present

## 2020-08-04 DIAGNOSIS — E1169 Type 2 diabetes mellitus with other specified complication: Secondary | ICD-10-CM | POA: Diagnosis not present

## 2020-08-04 DIAGNOSIS — R739 Hyperglycemia, unspecified: Secondary | ICD-10-CM | POA: Diagnosis not present

## 2020-08-04 DIAGNOSIS — I10 Essential (primary) hypertension: Secondary | ICD-10-CM | POA: Diagnosis not present

## 2020-08-04 DIAGNOSIS — E782 Mixed hyperlipidemia: Secondary | ICD-10-CM | POA: Diagnosis not present

## 2020-08-13 DIAGNOSIS — Z23 Encounter for immunization: Secondary | ICD-10-CM | POA: Diagnosis not present

## 2020-08-18 DIAGNOSIS — R945 Abnormal results of liver function studies: Secondary | ICD-10-CM | POA: Diagnosis not present

## 2020-08-18 DIAGNOSIS — E785 Hyperlipidemia, unspecified: Secondary | ICD-10-CM | POA: Diagnosis not present

## 2020-08-18 DIAGNOSIS — R739 Hyperglycemia, unspecified: Secondary | ICD-10-CM | POA: Diagnosis not present

## 2020-08-25 DIAGNOSIS — E782 Mixed hyperlipidemia: Secondary | ICD-10-CM | POA: Diagnosis not present

## 2020-08-25 DIAGNOSIS — R739 Hyperglycemia, unspecified: Secondary | ICD-10-CM | POA: Diagnosis not present

## 2020-08-25 DIAGNOSIS — I10 Essential (primary) hypertension: Secondary | ICD-10-CM | POA: Diagnosis not present

## 2020-09-01 DIAGNOSIS — R739 Hyperglycemia, unspecified: Secondary | ICD-10-CM | POA: Diagnosis not present

## 2020-09-01 DIAGNOSIS — E669 Obesity, unspecified: Secondary | ICD-10-CM | POA: Diagnosis not present

## 2020-09-01 DIAGNOSIS — E785 Hyperlipidemia, unspecified: Secondary | ICD-10-CM | POA: Diagnosis not present

## 2020-09-01 DIAGNOSIS — I10 Essential (primary) hypertension: Secondary | ICD-10-CM | POA: Diagnosis not present

## 2020-10-08 ENCOUNTER — Other Ambulatory Visit: Payer: Self-pay

## 2020-10-08 ENCOUNTER — Ambulatory Visit (AMBULATORY_SURGERY_CENTER): Payer: Self-pay

## 2020-10-08 VITALS — Ht 68.0 in | Wt 264.6 lb

## 2020-10-08 DIAGNOSIS — Z1211 Encounter for screening for malignant neoplasm of colon: Secondary | ICD-10-CM

## 2020-10-08 MED ORDER — NA SULFATE-K SULFATE-MG SULF 17.5-3.13-1.6 GM/177ML PO SOLN: 1.0000 | Freq: Once | ORAL | 0 refills | Status: AC

## 2020-10-08 NOTE — Progress Notes (Signed)
No egg or soy allergy known to patient  No issues with past sedation with any surgeries or procedures Patient denies ever being told they had issues or difficulty with intubation  No FH of Malignant Hyperthermia No diet pills per patient No home 02 use per patient  No blood thinners per patient  Pt denies issues with constipation  No A fib or A flutter  EMMI video to pt or via MyChart  COVID 19 guidelines implemented in PV today with Pt and RN  Pt is fully vaccinated  for Covid   Suprep Coupon given to pt in PV today , Code to Pharmacy and  NO PA's for preps discussed with pt In PV today  Discussed with pt there will be an out-of-pocket cost for prep and that varies from $0 to 70 dollars   Due to the COVID-19 pandemic we are asking patients to follow certain guidelines.  Pt aware of COVID protocols and [facility] guidelines   

## 2020-10-16 ENCOUNTER — Encounter: Payer: Self-pay | Admitting: Gastroenterology

## 2020-10-22 ENCOUNTER — Encounter: Payer: Self-pay | Admitting: Gastroenterology

## 2020-10-22 ENCOUNTER — Other Ambulatory Visit: Payer: Self-pay

## 2020-10-22 ENCOUNTER — Ambulatory Visit (AMBULATORY_SURGERY_CENTER): Payer: BC Managed Care – PPO | Admitting: Gastroenterology

## 2020-10-22 VITALS — BP 116/59 | HR 81 | Temp 96.6°F | Resp 11 | Ht 68.0 in | Wt 264.4 lb

## 2020-10-22 DIAGNOSIS — D128 Benign neoplasm of rectum: Secondary | ICD-10-CM

## 2020-10-22 DIAGNOSIS — D123 Benign neoplasm of transverse colon: Secondary | ICD-10-CM

## 2020-10-22 DIAGNOSIS — D122 Benign neoplasm of ascending colon: Secondary | ICD-10-CM | POA: Diagnosis not present

## 2020-10-22 DIAGNOSIS — Z1211 Encounter for screening for malignant neoplasm of colon: Secondary | ICD-10-CM

## 2020-10-22 DIAGNOSIS — K621 Rectal polyp: Secondary | ICD-10-CM

## 2020-10-22 HISTORY — PX: COLONOSCOPY: SHX174

## 2020-10-22 MED ORDER — SODIUM CHLORIDE 0.9 % IV SOLN: 500.0000 mL | Freq: Once | INTRAVENOUS | Status: AC

## 2020-10-22 NOTE — Progress Notes (Signed)
Medical history reviewed with no changes noted. VS assessed by C.W 

## 2020-10-22 NOTE — Op Note (Signed)
[facility] Patient Name: Tyler Sanders Procedure Date: 10/22/2020 11:26 AM MRN: [id number] Endoscopist: Mallie Mussel L. Loletha Carrow , MD Age: 65 Referring MD:  Date of Birth: 10-12-1955 Gender: Male Account #: [id number] Procedure:                Colonoscopy Indications:              Screening for colorectal malignant neoplasm, This                            is the patient's first colonoscopy Medicines:                Monitored Anesthesia Care Procedure:                Pre-Anesthesia Assessment:                           - Prior to the procedure, a History and Physical                            was performed, and patient medications and                            allergies were reviewed. The patient's tolerance of                            previous anesthesia was also reviewed. The risks                            and benefits of the procedure and the sedation                            options and risks were discussed with the patient.                            All questions were answered, and informed consent                            was obtained. Prior Anticoagulants: The patient has                            taken no previous anticoagulant or antiplatelet                            agents. ASA Grade Assessment: III - A patient with                            severe systemic disease. After reviewing the risks                            and benefits, the patient was deemed in                            satisfactory condition to undergo the procedure.  After obtaining informed consent, the colonoscope                            was passed under direct vision. Throughout the                            procedure, the patient's blood pressure, pulse, and                            oxygen saturations were monitored continuously. The                            Olympus CF-HQ190 [id number]) Colonoscope was                            introduced through the anus  and advanced to the the                            cecum, identified by appendiceal orifice and                            ileocecal valve. The colonoscopy was performed                            without difficulty. The patient tolerated the                            procedure well. The quality of the bowel                            preparation was good. The ileocecal valve,                            appendiceal orifice, and rectum were photographed. Scope In: 11:37:26 AM Scope Out: 11:58:42 AM Scope Withdrawal Time: 0 hours 18 minutes 48 seconds  Total Procedure Duration: 0 hours 21 minutes 16 seconds  Findings:                 The perianal and digital rectal examinations were                            normal.                           Three sessile polyps were found in the ascending                            colon. The polyps were 2 to 5 mm in size. These                            polyps were removed with a cold snare. Resection                            and retrieval were complete. (Jar 1)  Four sessile polyps were found in the transverse                            colon. The polyps were 3 to 6 mm in size. These                            polyps were removed with a cold snare. Resection                            and retrieval were complete. (Jar 2)                           A 4 mm polyp was found in the rectum. The polyp was                            sessile. The polyp was removed with a cold snare.                            Resection and retrieval were complete. (Jar 3)                           The exam was otherwise without abnormality on                            direct and retroflexion views. (small hypertropied                            anal papilla seen) Complications:            No immediate complications. Estimated Blood Loss:     Estimated blood loss was minimal. Impression:               - Three 2 to 5 mm polyps in the ascending colon,                             removed with a cold snare. Resected and retrieved.                           - Four 3 to 6 mm polyps in the transverse colon,                            removed with a cold snare. Resected and retrieved.                           - One 4 mm polyp in the rectum, removed with a cold                            snare. Resected and retrieved.                           - The examination was otherwise normal on direct  and retroflexion views. Recommendation:           - Patient has a contact number available for                            emergencies. The signs and symptoms of potential                            delayed complications were discussed with the                            patient. Return to normal activities tomorrow.                            Written discharge instructions were provided to the                            patient.                           - Resume previous diet.                           - Continue present medications.                           - Await pathology results.                           - Repeat colonoscopy is recommended for                            surveillance. The colonoscopy date will be                            determined after pathology results from today's                            exam become available for review. Odarius Dines L. Loletha Carrow, MD 10/22/2020 12:04:38 PM This report has been signed electronically.

## 2020-10-22 NOTE — Progress Notes (Signed)
Called to room to assist during endoscopic procedure.  Patient ID and intended procedure confirmed with present staff. Received instructions for my participation in the procedure from the performing physician.  

## 2020-10-22 NOTE — Patient Instructions (Signed)
Continue previous diet and medications. Awaiting pathology will repeat colonoscopy based on path results from today.  YOU HAD AN ENDOSCOPIC PROCEDURE TODAY AT [facility]:   Refer to the procedure report that was given to you for any specific questions about what was found during the examination.  If the procedure report does not answer your questions, please call your gastroenterologist to clarify.  If you requested that your care partner not be given the details of your procedure findings, then the procedure report has been included in a sealed envelope for you to review at your convenience later.  YOU SHOULD EXPECT: Some feelings of bloating in the abdomen. Passage of more gas than usual.  Walking can help get rid of the air that was put into your GI tract during the procedure and reduce the bloating. If you had a lower endoscopy (such as a colonoscopy or flexible sigmoidoscopy) you may notice spotting of blood in your stool or on the toilet paper. If you underwent a bowel prep for your procedure, you may not have a normal bowel movement for a few days.  Please Note:  You might notice some irritation and congestion in your nose or some drainage.  This is from the oxygen used during your procedure.  There is no need for concern and it should clear up in a day or so.  SYMPTOMS TO REPORT IMMEDIATELY:   Following lower endoscopy (colonoscopy or flexible sigmoidoscopy):  Excessive amounts of blood in the stool  Significant tenderness or worsening of abdominal pains  Swelling of the abdomen that is new, acute  Fever of 100F or higher   For urgent or emergent issues, a gastroenterologist can be reached at any hour by calling [phone]. Do not use MyChart messaging for urgent concerns.    DIET:  We do recommend a small meal at first, but then you may proceed to your regular diet.  Drink plenty of fluids but you should avoid alcoholic beverages for 24 hours.  ACTIVITY:  You  should plan to take it easy for the rest of today and you should NOT DRIVE or use heavy machinery until tomorrow (because of the sedation medicines used during the test).    FOLLOW UP: Our staff will call the number listed on your records 48-72 hours following your procedure to check on you and address any questions or concerns that you may have regarding the information given to you following your procedure. If we do not reach you, we will leave a message.  We will attempt to reach you two times.  During this call, we will ask if you have developed any symptoms of COVID 19. If you develop any symptoms (ie: fever, flu-like symptoms, shortness of breath, cough etc.) before then, please call [phone].  If you test positive for Covid 19 in the 2 weeks post procedure, please call and report this information to [place].    If any biopsies were taken you will be contacted by phone or by letter within the next 1-3 weeks.  Please call us at [phone] if you have not heard about the biopsies in 3 weeks.    SIGNATURES/CONFIDENTIALITY: You and/or your care partner have signed paperwork which will be entered into your electronic medical record.  These signatures attest to the fact that that the information above on your After Visit Summary has been reviewed and is understood.  Full responsibility of the confidentiality of this discharge information lies with you and/or your care-partner.

## 2020-10-22 NOTE — Progress Notes (Signed)
Report given to PACU, vss 

## 2020-10-26 ENCOUNTER — Telehealth: Payer: Self-pay | Admitting: *Deleted

## 2020-10-26 ENCOUNTER — Telehealth: Payer: Self-pay

## 2020-10-26 NOTE — Telephone Encounter (Signed)
LVM

## 2020-10-26 NOTE — Telephone Encounter (Signed)
Follow up call made. 

## 2020-10-29 ENCOUNTER — Encounter: Payer: Self-pay | Admitting: Gastroenterology

## 2020-11-24 DIAGNOSIS — N529 Male erectile dysfunction, unspecified: Secondary | ICD-10-CM | POA: Diagnosis not present

## 2020-11-24 DIAGNOSIS — N411 Chronic prostatitis: Secondary | ICD-10-CM | POA: Diagnosis not present

## 2020-11-24 DIAGNOSIS — K635 Polyp of colon: Secondary | ICD-10-CM | POA: Diagnosis not present

## 2020-11-24 DIAGNOSIS — Z7185 Encounter for immunization safety counseling: Secondary | ICD-10-CM | POA: Diagnosis not present

## 2020-12-24 DIAGNOSIS — I10 Essential (primary) hypertension: Secondary | ICD-10-CM | POA: Diagnosis not present

## 2020-12-24 DIAGNOSIS — R739 Hyperglycemia, unspecified: Secondary | ICD-10-CM | POA: Diagnosis not present

## 2020-12-24 DIAGNOSIS — E785 Hyperlipidemia, unspecified: Secondary | ICD-10-CM | POA: Diagnosis not present

## 2021-01-04 DIAGNOSIS — N529 Male erectile dysfunction, unspecified: Secondary | ICD-10-CM | POA: Diagnosis not present

## 2021-01-04 DIAGNOSIS — I10 Essential (primary) hypertension: Secondary | ICD-10-CM | POA: Diagnosis not present

## 2021-01-04 DIAGNOSIS — Z6838 Body mass index (BMI) 38.0-38.9, adult: Secondary | ICD-10-CM | POA: Diagnosis not present

## 2021-01-04 DIAGNOSIS — R7303 Prediabetes: Secondary | ICD-10-CM | POA: Diagnosis not present

## 2021-04-05 DIAGNOSIS — I10 Essential (primary) hypertension: Secondary | ICD-10-CM | POA: Diagnosis not present

## 2021-04-05 DIAGNOSIS — K219 Gastro-esophageal reflux disease without esophagitis: Secondary | ICD-10-CM | POA: Diagnosis not present

## 2021-04-05 DIAGNOSIS — E669 Obesity, unspecified: Secondary | ICD-10-CM | POA: Diagnosis not present

## 2021-04-29 DIAGNOSIS — Z125 Encounter for screening for malignant neoplasm of prostate: Secondary | ICD-10-CM | POA: Diagnosis not present

## 2021-04-29 DIAGNOSIS — Z0000 Encounter for general adult medical examination without abnormal findings: Secondary | ICD-10-CM | POA: Diagnosis not present

## 2021-05-04 DIAGNOSIS — R739 Hyperglycemia, unspecified: Secondary | ICD-10-CM | POA: Diagnosis not present

## 2021-05-04 DIAGNOSIS — Z0000 Encounter for general adult medical examination without abnormal findings: Secondary | ICD-10-CM | POA: Diagnosis not present

## 2021-05-04 DIAGNOSIS — Z23 Encounter for immunization: Secondary | ICD-10-CM | POA: Diagnosis not present

## 2021-05-31 DIAGNOSIS — N529 Male erectile dysfunction, unspecified: Secondary | ICD-10-CM | POA: Diagnosis not present

## 2021-07-01 DIAGNOSIS — E669 Obesity, unspecified: Secondary | ICD-10-CM | POA: Diagnosis not present

## 2021-07-01 DIAGNOSIS — N529 Male erectile dysfunction, unspecified: Secondary | ICD-10-CM | POA: Diagnosis not present

## 2021-07-01 DIAGNOSIS — Z6838 Body mass index (BMI) 38.0-38.9, adult: Secondary | ICD-10-CM | POA: Diagnosis not present

## 2021-07-01 DIAGNOSIS — I10 Essential (primary) hypertension: Secondary | ICD-10-CM | POA: Diagnosis not present

## 2021-11-08 DIAGNOSIS — R739 Hyperglycemia, unspecified: Secondary | ICD-10-CM | POA: Diagnosis not present

## 2021-11-08 DIAGNOSIS — E782 Mixed hyperlipidemia: Secondary | ICD-10-CM | POA: Diagnosis not present

## 2021-11-08 DIAGNOSIS — I10 Essential (primary) hypertension: Secondary | ICD-10-CM | POA: Diagnosis not present

## 2021-11-10 DIAGNOSIS — I10 Essential (primary) hypertension: Secondary | ICD-10-CM | POA: Diagnosis not present

## 2021-11-10 DIAGNOSIS — M25811 Other specified joint disorders, right shoulder: Secondary | ICD-10-CM | POA: Diagnosis not present

## 2021-11-10 DIAGNOSIS — M545 Low back pain, unspecified: Secondary | ICD-10-CM | POA: Diagnosis not present

## 2021-11-10 DIAGNOSIS — R739 Hyperglycemia, unspecified: Secondary | ICD-10-CM | POA: Diagnosis not present

## 2021-11-10 DIAGNOSIS — E782 Mixed hyperlipidemia: Secondary | ICD-10-CM | POA: Diagnosis not present

## 2022-05-30 DIAGNOSIS — E782 Mixed hyperlipidemia: Secondary | ICD-10-CM | POA: Diagnosis not present

## 2022-05-30 DIAGNOSIS — Z114 Encounter for screening for human immunodeficiency virus [HIV]: Secondary | ICD-10-CM | POA: Diagnosis not present

## 2022-05-30 DIAGNOSIS — Z125 Encounter for screening for malignant neoplasm of prostate: Secondary | ICD-10-CM | POA: Diagnosis not present

## 2022-05-30 DIAGNOSIS — Z0000 Encounter for general adult medical examination without abnormal findings: Secondary | ICD-10-CM | POA: Diagnosis not present

## 2022-06-07 DIAGNOSIS — N529 Male erectile dysfunction, unspecified: Secondary | ICD-10-CM | POA: Diagnosis not present

## 2022-06-07 DIAGNOSIS — Z23 Encounter for immunization: Secondary | ICD-10-CM | POA: Diagnosis not present

## 2022-06-07 DIAGNOSIS — Z0000 Encounter for general adult medical examination without abnormal findings: Secondary | ICD-10-CM | POA: Diagnosis not present

## 2022-06-07 DIAGNOSIS — K219 Gastro-esophageal reflux disease without esophagitis: Secondary | ICD-10-CM | POA: Diagnosis not present

## 2022-07-05 DIAGNOSIS — N529 Male erectile dysfunction, unspecified: Secondary | ICD-10-CM | POA: Diagnosis not present

## 2022-07-05 DIAGNOSIS — K219 Gastro-esophageal reflux disease without esophagitis: Secondary | ICD-10-CM | POA: Diagnosis not present

## 2022-07-05 DIAGNOSIS — F411 Generalized anxiety disorder: Secondary | ICD-10-CM | POA: Diagnosis not present

## 2022-08-23 DIAGNOSIS — Z125 Encounter for screening for malignant neoplasm of prostate: Secondary | ICD-10-CM | POA: Diagnosis not present

## 2022-08-23 DIAGNOSIS — N5201 Erectile dysfunction due to arterial insufficiency: Secondary | ICD-10-CM | POA: Diagnosis not present

## 2022-08-23 DIAGNOSIS — N3943 Post-void dribbling: Secondary | ICD-10-CM | POA: Diagnosis not present

## 2022-08-23 DIAGNOSIS — R6882 Decreased libido: Secondary | ICD-10-CM | POA: Diagnosis not present

## 2022-08-30 DIAGNOSIS — F331 Major depressive disorder, recurrent, moderate: Secondary | ICD-10-CM | POA: Diagnosis not present

## 2022-08-30 DIAGNOSIS — F411 Generalized anxiety disorder: Secondary | ICD-10-CM | POA: Diagnosis not present

## 2022-08-30 DIAGNOSIS — G4700 Insomnia, unspecified: Secondary | ICD-10-CM | POA: Diagnosis not present

## 2022-08-30 DIAGNOSIS — M1712 Unilateral primary osteoarthritis, left knee: Secondary | ICD-10-CM | POA: Diagnosis not present

## 2022-09-28 DIAGNOSIS — G4700 Insomnia, unspecified: Secondary | ICD-10-CM | POA: Diagnosis not present

## 2022-09-28 DIAGNOSIS — F411 Generalized anxiety disorder: Secondary | ICD-10-CM | POA: Diagnosis not present

## 2022-09-28 DIAGNOSIS — I10 Essential (primary) hypertension: Secondary | ICD-10-CM | POA: Diagnosis not present

## 2022-09-28 DIAGNOSIS — F331 Major depressive disorder, recurrent, moderate: Secondary | ICD-10-CM | POA: Diagnosis not present

## 2022-11-06 DIAGNOSIS — M79643 Pain in unspecified hand: Secondary | ICD-10-CM | POA: Diagnosis not present

## 2022-11-06 DIAGNOSIS — R0981 Nasal congestion: Secondary | ICD-10-CM | POA: Diagnosis not present

## 2022-11-06 DIAGNOSIS — R051 Acute cough: Secondary | ICD-10-CM | POA: Diagnosis not present

## 2022-12-05 DIAGNOSIS — I10 Essential (primary) hypertension: Secondary | ICD-10-CM | POA: Diagnosis not present

## 2022-12-05 DIAGNOSIS — E559 Vitamin D deficiency, unspecified: Secondary | ICD-10-CM | POA: Diagnosis not present

## 2022-12-05 DIAGNOSIS — E782 Mixed hyperlipidemia: Secondary | ICD-10-CM | POA: Diagnosis not present

## 2022-12-07 DIAGNOSIS — I10 Essential (primary) hypertension: Secondary | ICD-10-CM | POA: Diagnosis not present

## 2022-12-07 DIAGNOSIS — E782 Mixed hyperlipidemia: Secondary | ICD-10-CM | POA: Diagnosis not present

## 2022-12-07 DIAGNOSIS — E559 Vitamin D deficiency, unspecified: Secondary | ICD-10-CM | POA: Diagnosis not present

## 2022-12-07 DIAGNOSIS — K219 Gastro-esophageal reflux disease without esophagitis: Secondary | ICD-10-CM | POA: Diagnosis not present

## 2023-02-24 DIAGNOSIS — N529 Male erectile dysfunction, unspecified: Secondary | ICD-10-CM | POA: Diagnosis not present

## 2023-02-24 DIAGNOSIS — E785 Hyperlipidemia, unspecified: Secondary | ICD-10-CM | POA: Diagnosis not present

## 2023-02-24 DIAGNOSIS — G629 Polyneuropathy, unspecified: Secondary | ICD-10-CM | POA: Diagnosis not present

## 2023-02-24 DIAGNOSIS — I10 Essential (primary) hypertension: Secondary | ICD-10-CM | POA: Diagnosis not present

## 2023-02-27 DIAGNOSIS — G629 Polyneuropathy, unspecified: Secondary | ICD-10-CM | POA: Diagnosis not present

## 2023-02-27 DIAGNOSIS — E785 Hyperlipidemia, unspecified: Secondary | ICD-10-CM | POA: Diagnosis not present

## 2023-02-27 DIAGNOSIS — N529 Male erectile dysfunction, unspecified: Secondary | ICD-10-CM | POA: Diagnosis not present

## 2023-02-27 DIAGNOSIS — Z125 Encounter for screening for malignant neoplasm of prostate: Secondary | ICD-10-CM | POA: Diagnosis not present

## 2023-02-27 DIAGNOSIS — I10 Essential (primary) hypertension: Secondary | ICD-10-CM | POA: Diagnosis not present

## 2023-03-06 DIAGNOSIS — N529 Male erectile dysfunction, unspecified: Secondary | ICD-10-CM | POA: Diagnosis not present

## 2023-03-06 DIAGNOSIS — Z79899 Other long term (current) drug therapy: Secondary | ICD-10-CM | POA: Diagnosis not present

## 2023-03-06 DIAGNOSIS — N401 Enlarged prostate with lower urinary tract symptoms: Secondary | ICD-10-CM | POA: Diagnosis not present

## 2023-03-09 DIAGNOSIS — K219 Gastro-esophageal reflux disease without esophagitis: Secondary | ICD-10-CM | POA: Diagnosis not present

## 2023-03-09 DIAGNOSIS — E669 Obesity, unspecified: Secondary | ICD-10-CM | POA: Diagnosis not present

## 2023-03-09 DIAGNOSIS — R7989 Other specified abnormal findings of blood chemistry: Secondary | ICD-10-CM | POA: Diagnosis not present

## 2023-03-09 DIAGNOSIS — R5383 Other fatigue: Secondary | ICD-10-CM | POA: Diagnosis not present

## 2023-03-09 DIAGNOSIS — N529 Male erectile dysfunction, unspecified: Secondary | ICD-10-CM | POA: Diagnosis not present

## 2023-03-30 DIAGNOSIS — I10 Essential (primary) hypertension: Secondary | ICD-10-CM | POA: Diagnosis not present

## 2023-03-30 DIAGNOSIS — N289 Disorder of kidney and ureter, unspecified: Secondary | ICD-10-CM | POA: Diagnosis not present

## 2023-03-30 DIAGNOSIS — D510 Vitamin B12 deficiency anemia due to intrinsic factor deficiency: Secondary | ICD-10-CM | POA: Diagnosis not present

## 2023-04-06 DIAGNOSIS — E875 Hyperkalemia: Secondary | ICD-10-CM | POA: Diagnosis not present

## 2023-04-11 DIAGNOSIS — N401 Enlarged prostate with lower urinary tract symptoms: Secondary | ICD-10-CM | POA: Diagnosis not present

## 2023-04-11 DIAGNOSIS — N529 Male erectile dysfunction, unspecified: Secondary | ICD-10-CM | POA: Diagnosis not present

## 2023-06-02 DIAGNOSIS — K219 Gastro-esophageal reflux disease without esophagitis: Secondary | ICD-10-CM | POA: Diagnosis not present

## 2023-06-02 DIAGNOSIS — N529 Male erectile dysfunction, unspecified: Secondary | ICD-10-CM | POA: Diagnosis not present

## 2023-06-02 DIAGNOSIS — G629 Polyneuropathy, unspecified: Secondary | ICD-10-CM | POA: Diagnosis not present

## 2023-06-02 DIAGNOSIS — I10 Essential (primary) hypertension: Secondary | ICD-10-CM | POA: Diagnosis not present

## 2023-08-02 DIAGNOSIS — N401 Enlarged prostate with lower urinary tract symptoms: Secondary | ICD-10-CM | POA: Diagnosis not present

## 2023-08-02 DIAGNOSIS — N529 Male erectile dysfunction, unspecified: Secondary | ICD-10-CM | POA: Diagnosis not present

## 2023-08-23 DIAGNOSIS — R059 Cough, unspecified: Secondary | ICD-10-CM | POA: Diagnosis not present

## 2023-08-23 DIAGNOSIS — R0981 Nasal congestion: Secondary | ICD-10-CM | POA: Diagnosis not present

## 2023-12-04 DIAGNOSIS — R7309 Other abnormal glucose: Secondary | ICD-10-CM | POA: Diagnosis not present

## 2023-12-04 DIAGNOSIS — K219 Gastro-esophageal reflux disease without esophagitis: Secondary | ICD-10-CM | POA: Diagnosis not present

## 2023-12-04 DIAGNOSIS — G629 Polyneuropathy, unspecified: Secondary | ICD-10-CM | POA: Diagnosis not present

## 2023-12-04 DIAGNOSIS — E785 Hyperlipidemia, unspecified: Secondary | ICD-10-CM | POA: Diagnosis not present

## 2023-12-04 DIAGNOSIS — I10 Essential (primary) hypertension: Secondary | ICD-10-CM | POA: Diagnosis not present

## 2023-12-04 DIAGNOSIS — N529 Male erectile dysfunction, unspecified: Secondary | ICD-10-CM | POA: Diagnosis not present

## 2024-01-30 DIAGNOSIS — N529 Male erectile dysfunction, unspecified: Secondary | ICD-10-CM | POA: Diagnosis not present

## 2024-01-30 DIAGNOSIS — N401 Enlarged prostate with lower urinary tract symptoms: Secondary | ICD-10-CM | POA: Diagnosis not present

## 2024-02-29 DIAGNOSIS — N529 Male erectile dysfunction, unspecified: Secondary | ICD-10-CM | POA: Diagnosis not present

## 2024-02-29 DIAGNOSIS — I10 Essential (primary) hypertension: Secondary | ICD-10-CM | POA: Diagnosis not present

## 2024-02-29 DIAGNOSIS — R5381 Other malaise: Secondary | ICD-10-CM | POA: Diagnosis not present

## 2024-02-29 DIAGNOSIS — R252 Cramp and spasm: Secondary | ICD-10-CM | POA: Diagnosis not present

## 2024-03-15 DIAGNOSIS — R252 Cramp and spasm: Secondary | ICD-10-CM | POA: Diagnosis not present

## 2024-03-15 DIAGNOSIS — I10 Essential (primary) hypertension: Secondary | ICD-10-CM | POA: Diagnosis not present

## 2024-03-15 DIAGNOSIS — N529 Male erectile dysfunction, unspecified: Secondary | ICD-10-CM | POA: Diagnosis not present

## 2024-03-15 DIAGNOSIS — R5381 Other malaise: Secondary | ICD-10-CM | POA: Diagnosis not present

## 2024-03-15 DIAGNOSIS — Z13220 Encounter for screening for lipoid disorders: Secondary | ICD-10-CM | POA: Diagnosis not present

## 2024-03-26 DIAGNOSIS — N529 Male erectile dysfunction, unspecified: Secondary | ICD-10-CM | POA: Diagnosis not present

## 2024-03-26 DIAGNOSIS — Z125 Encounter for screening for malignant neoplasm of prostate: Secondary | ICD-10-CM | POA: Diagnosis not present

## 2024-03-26 DIAGNOSIS — E291 Testicular hypofunction: Secondary | ICD-10-CM | POA: Diagnosis not present

## 2024-03-26 DIAGNOSIS — N401 Enlarged prostate with lower urinary tract symptoms: Secondary | ICD-10-CM | POA: Diagnosis not present

## 2024-04-10 DIAGNOSIS — N400 Enlarged prostate without lower urinary tract symptoms: Secondary | ICD-10-CM | POA: Diagnosis not present

## 2024-04-10 DIAGNOSIS — I4510 Unspecified right bundle-branch block: Secondary | ICD-10-CM | POA: Diagnosis not present

## 2024-04-10 DIAGNOSIS — K219 Gastro-esophageal reflux disease without esophagitis: Secondary | ICD-10-CM | POA: Diagnosis not present

## 2024-04-10 DIAGNOSIS — Z6835 Body mass index (BMI) 35.0-35.9, adult: Secondary | ICD-10-CM | POA: Diagnosis not present

## 2024-04-10 DIAGNOSIS — I10 Essential (primary) hypertension: Secondary | ICD-10-CM | POA: Diagnosis not present

## 2024-04-10 DIAGNOSIS — I444 Left anterior fascicular block: Secondary | ICD-10-CM | POA: Diagnosis not present

## 2024-04-10 DIAGNOSIS — R112 Nausea with vomiting, unspecified: Secondary | ICD-10-CM | POA: Diagnosis not present

## 2024-04-10 DIAGNOSIS — Z79899 Other long term (current) drug therapy: Secondary | ICD-10-CM | POA: Diagnosis not present

## 2024-04-10 DIAGNOSIS — E669 Obesity, unspecified: Secondary | ICD-10-CM | POA: Diagnosis not present

## 2024-04-10 DIAGNOSIS — R270 Ataxia, unspecified: Secondary | ICD-10-CM | POA: Diagnosis not present

## 2024-04-10 DIAGNOSIS — H8110 Benign paroxysmal vertigo, unspecified ear: Secondary | ICD-10-CM | POA: Diagnosis not present

## 2024-04-10 DIAGNOSIS — R001 Bradycardia, unspecified: Secondary | ICD-10-CM | POA: Diagnosis not present

## 2024-04-10 DIAGNOSIS — R42 Dizziness and giddiness: Secondary | ICD-10-CM | POA: Diagnosis not present

## 2024-04-11 DIAGNOSIS — R001 Bradycardia, unspecified: Secondary | ICD-10-CM | POA: Diagnosis not present

## 2024-04-11 DIAGNOSIS — R112 Nausea with vomiting, unspecified: Secondary | ICD-10-CM | POA: Diagnosis not present

## 2024-04-11 DIAGNOSIS — H8110 Benign paroxysmal vertigo, unspecified ear: Secondary | ICD-10-CM | POA: Diagnosis not present

## 2024-04-15 DIAGNOSIS — I10 Essential (primary) hypertension: Secondary | ICD-10-CM | POA: Diagnosis not present

## 2024-04-15 DIAGNOSIS — H8110 Benign paroxysmal vertigo, unspecified ear: Secondary | ICD-10-CM | POA: Diagnosis not present

## 2024-05-13 DIAGNOSIS — N401 Enlarged prostate with lower urinary tract symptoms: Secondary | ICD-10-CM | POA: Diagnosis not present

## 2024-05-13 DIAGNOSIS — E291 Testicular hypofunction: Secondary | ICD-10-CM | POA: Diagnosis not present

## 2024-05-13 DIAGNOSIS — N138 Other obstructive and reflux uropathy: Secondary | ICD-10-CM | POA: Diagnosis not present

## 2024-05-13 DIAGNOSIS — Z125 Encounter for screening for malignant neoplasm of prostate: Secondary | ICD-10-CM | POA: Diagnosis not present

## 2024-05-20 DIAGNOSIS — Z125 Encounter for screening for malignant neoplasm of prostate: Secondary | ICD-10-CM | POA: Diagnosis not present

## 2024-05-20 DIAGNOSIS — N401 Enlarged prostate with lower urinary tract symptoms: Secondary | ICD-10-CM | POA: Diagnosis not present

## 2024-05-20 DIAGNOSIS — E291 Testicular hypofunction: Secondary | ICD-10-CM | POA: Diagnosis not present

## 2024-05-20 DIAGNOSIS — N529 Male erectile dysfunction, unspecified: Secondary | ICD-10-CM | POA: Diagnosis not present

## 2024-05-21 DIAGNOSIS — N529 Male erectile dysfunction, unspecified: Secondary | ICD-10-CM | POA: Diagnosis not present

## 2024-05-21 DIAGNOSIS — E291 Testicular hypofunction: Secondary | ICD-10-CM | POA: Diagnosis not present

## 2024-05-27 DIAGNOSIS — E291 Testicular hypofunction: Secondary | ICD-10-CM | POA: Diagnosis not present

## 2024-06-13 DIAGNOSIS — N528 Other male erectile dysfunction: Secondary | ICD-10-CM | POA: Diagnosis not present

## 2024-06-13 DIAGNOSIS — R7989 Other specified abnormal findings of blood chemistry: Secondary | ICD-10-CM | POA: Diagnosis not present

## 2024-06-13 DIAGNOSIS — E669 Obesity, unspecified: Secondary | ICD-10-CM | POA: Diagnosis not present

## 2024-06-13 DIAGNOSIS — N281 Cyst of kidney, acquired: Secondary | ICD-10-CM | POA: Diagnosis not present
